# Patient Record
Sex: Female | Born: 1965 | Race: Black or African American | Hispanic: No | Marital: Married | Smoking: Never smoker
Health system: Southern US, Community
[De-identification: ages and names within clinical notes are randomized; demographics above are authoritative.]

---

## 1997-07-20 ENCOUNTER — Ambulatory Visit (HOSPITAL_COMMUNITY): Admission: RE | Admit: 1997-07-20 | Discharge: 1997-07-20 | Payer: Self-pay | Admitting: Obstetrics and Gynecology

## 1997-09-20 ENCOUNTER — Other Ambulatory Visit: Admission: RE | Admit: 1997-09-20 | Discharge: 1997-09-20 | Payer: Self-pay | Admitting: Obstetrics and Gynecology

## 1997-11-02 ENCOUNTER — Other Ambulatory Visit: Admission: RE | Admit: 1997-11-02 | Discharge: 1997-11-02 | Payer: Self-pay | Admitting: Obstetrics and Gynecology

## 1997-12-08 ENCOUNTER — Inpatient Hospital Stay (HOSPITAL_COMMUNITY): Admission: AD | Admit: 1997-12-08 | Discharge: 1997-12-11 | Payer: Self-pay | Admitting: Obstetrics and Gynecology

## 1997-12-11 ENCOUNTER — Encounter (HOSPITAL_COMMUNITY): Admission: RE | Admit: 1997-12-11 | Discharge: 1998-01-14 | Payer: Self-pay | Admitting: *Deleted

## 1998-08-22 ENCOUNTER — Other Ambulatory Visit: Admission: RE | Admit: 1998-08-22 | Discharge: 1998-08-22 | Payer: Self-pay | Admitting: Obstetrics and Gynecology

## 1999-07-14 ENCOUNTER — Other Ambulatory Visit: Admission: RE | Admit: 1999-07-14 | Discharge: 1999-07-14 | Payer: Self-pay | Admitting: *Deleted

## 1999-09-07 ENCOUNTER — Ambulatory Visit (HOSPITAL_COMMUNITY): Admission: RE | Admit: 1999-09-07 | Discharge: 1999-09-07 | Payer: Self-pay | Admitting: Obstetrics & Gynecology

## 1999-09-07 ENCOUNTER — Encounter: Payer: Self-pay | Admitting: Obstetrics & Gynecology

## 1999-10-12 ENCOUNTER — Ambulatory Visit (HOSPITAL_COMMUNITY): Admission: RE | Admit: 1999-10-12 | Discharge: 1999-10-12 | Payer: Self-pay | Admitting: Obstetrics and Gynecology

## 1999-10-12 ENCOUNTER — Encounter: Payer: Self-pay | Admitting: Obstetrics and Gynecology

## 2000-01-03 ENCOUNTER — Encounter: Payer: Self-pay | Admitting: Obstetrics and Gynecology

## 2000-01-03 ENCOUNTER — Ambulatory Visit (HOSPITAL_COMMUNITY): Admission: RE | Admit: 2000-01-03 | Discharge: 2000-01-03 | Payer: Self-pay | Admitting: Obstetrics and Gynecology

## 2000-02-08 ENCOUNTER — Inpatient Hospital Stay (HOSPITAL_COMMUNITY): Admission: AD | Admit: 2000-02-08 | Discharge: 2000-02-10 | Payer: Self-pay | Admitting: Obstetrics and Gynecology

## 2000-02-13 ENCOUNTER — Encounter: Admission: RE | Admit: 2000-02-13 | Discharge: 2000-05-13 | Payer: Self-pay | Admitting: Obstetrics and Gynecology

## 2000-09-06 ENCOUNTER — Other Ambulatory Visit: Admission: RE | Admit: 2000-09-06 | Discharge: 2000-09-06 | Payer: Self-pay | Admitting: Obstetrics and Gynecology

## 2001-03-06 ENCOUNTER — Other Ambulatory Visit: Admission: RE | Admit: 2001-03-06 | Discharge: 2001-03-06 | Payer: Self-pay | Admitting: Obstetrics and Gynecology

## 2001-04-30 ENCOUNTER — Ambulatory Visit (HOSPITAL_COMMUNITY): Admission: RE | Admit: 2001-04-30 | Discharge: 2001-04-30 | Payer: Self-pay | Admitting: Obstetrics and Gynecology

## 2001-04-30 ENCOUNTER — Encounter: Payer: Self-pay | Admitting: Obstetrics and Gynecology

## 2001-09-15 ENCOUNTER — Inpatient Hospital Stay (HOSPITAL_COMMUNITY): Admission: AD | Admit: 2001-09-15 | Discharge: 2001-09-17 | Payer: Self-pay | Admitting: Obstetrics and Gynecology

## 2013-02-04 ENCOUNTER — Other Ambulatory Visit: Payer: Self-pay | Admitting: Family Medicine

## 2015-07-14 ENCOUNTER — Emergency Department (HOSPITAL_COMMUNITY)
Admission: EM | Admit: 2015-07-14 | Discharge: 2015-07-14 | Disposition: A | Discharge status: Home / Self Care | Payer: No Typology Code available for payment source | Attending: Emergency Medicine | Admitting: Emergency Medicine

## 2015-07-14 ENCOUNTER — Encounter (HOSPITAL_COMMUNITY): Payer: Self-pay | Admitting: *Deleted

## 2015-07-14 DIAGNOSIS — Y998 Other external cause status: Secondary | ICD-10-CM | POA: Insufficient documentation

## 2015-07-14 DIAGNOSIS — S161XXA Strain of muscle, fascia and tendon at neck level, initial encounter: Secondary | ICD-10-CM | POA: Diagnosis not present

## 2015-07-14 DIAGNOSIS — S3992XA Unspecified injury of lower back, initial encounter: Secondary | ICD-10-CM | POA: Insufficient documentation

## 2015-07-14 DIAGNOSIS — S199XXA Unspecified injury of neck, initial encounter: Secondary | ICD-10-CM | POA: Diagnosis present

## 2015-07-14 DIAGNOSIS — Y9389 Activity, other specified: Secondary | ICD-10-CM | POA: Insufficient documentation

## 2015-07-14 DIAGNOSIS — Z79899 Other long term (current) drug therapy: Secondary | ICD-10-CM | POA: Diagnosis not present

## 2015-07-14 DIAGNOSIS — Y92410 Unspecified street and highway as the place of occurrence of the external cause: Secondary | ICD-10-CM | POA: Diagnosis not present

## 2015-07-14 MED ORDER — CYCLOBENZAPRINE HCL 10 MG PO TABS: 10.0000 mg | ORAL_TABLET | Freq: Two times a day (BID) | ORAL | Status: AC | PRN

## 2015-07-14 MED ORDER — IBUPROFEN 600 MG PO TABS: 600.0000 mg | ORAL_TABLET | Freq: Four times a day (QID) | ORAL | Status: AC | PRN

## 2015-07-14 MED ORDER — HYDROCODONE-ACETAMINOPHEN 5-325 MG PO TABS: 1.0000 | ORAL_TABLET | Freq: Four times a day (QID) | ORAL | Status: AC | PRN
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
## 2019-11-30 ENCOUNTER — Other Ambulatory Visit: Payer: Self-pay | Admitting: Family Medicine

## 2019-11-30 DIAGNOSIS — Z1231 Encounter for screening mammogram for malignant neoplasm of breast: Secondary | ICD-10-CM

## 2019-12-14 ENCOUNTER — Ambulatory Visit: Payer: Self-pay

## 2019-12-16 ENCOUNTER — Other Ambulatory Visit: Payer: Self-pay

## 2019-12-16 ENCOUNTER — Ambulatory Visit: Payer: Self-pay

## 2019-12-16 ENCOUNTER — Ambulatory Visit
Admission: RE | Admit: 2019-12-16 | Discharge: 2019-12-16 | Disposition: A | Discharge status: Home / Self Care | Payer: BC Managed Care – PPO | Source: Ambulatory Visit | Attending: Family Medicine | Admitting: Family Medicine

## 2019-12-16 DIAGNOSIS — Z1231 Encounter for screening mammogram for malignant neoplasm of breast: Secondary | ICD-10-CM

## 2019-12-24 ENCOUNTER — Ambulatory Visit: Payer: BC Managed Care – PPO | Attending: Sports Medicine | Admitting: Physical Therapy

## 2019-12-24 ENCOUNTER — Encounter: Payer: Self-pay | Admitting: Physical Therapy

## 2019-12-24 ENCOUNTER — Other Ambulatory Visit: Payer: Self-pay

## 2019-12-24 DIAGNOSIS — M545 Low back pain, unspecified: Secondary | ICD-10-CM

## 2019-12-24 DIAGNOSIS — M6281 Muscle weakness (generalized): Secondary | ICD-10-CM | POA: Insufficient documentation

## 2019-12-24 DIAGNOSIS — G8929 Other chronic pain: Secondary | ICD-10-CM | POA: Insufficient documentation

## 2019-12-24 DIAGNOSIS — M25561 Pain in right knee: Secondary | ICD-10-CM | POA: Diagnosis present

## 2019-12-24 NOTE — Patient Instructions (Signed)
Access Code: [email or web address] URL: [email or web address] Date: 12/24/2019 Prepared by: Lysle Rubens  Exercises Sit to Stand without Arm Support - 1 x daily - 7 x weekly - 3 sets - 10 reps - 3 sec hold Supine Lower Trunk Rotation - 1 x daily - 7 x weekly - 3 sets - 10 reps - 5 sec hold Supine Single Knee to Chest Stretch - 1 x daily - 7 x weekly - 3 sets - 10 reps - 5 sec hold Supine Bridge - 1 x daily - 7 x weekly - 3 sets - 10 reps - 3 sec hold Quadricep Stretch with Chair and Counter Support - 1 x daily - 7 x weekly - 3 sets - 2 reps - 30 sec hold

## 2019-12-24 NOTE — Therapy (Signed)
[facility] [address] [place], [place], [address] Phone: [phone]   Fax:  [phone]  Physical Therapy Evaluation  Patient Details  Name: Anne Bradley MRN: [id number] Date of Birth: 1965-12-19 Referring Provider (PT): Cleophas Dunker   Encounter Date: 12/24/2019   PT End of Session - 12/24/19 1201    Visit Number 1    Date for PT Re-Evaluation 02/23/20    PT Start Time 0845    PT Stop Time 0929    PT Time Calculation (min) 44 min    Activity Tolerance Patient tolerated treatment well    Behavior During Therapy [facility] for tasks assessed/performed           History reviewed. No pertinent past medical history.  History reviewed. No pertinent surgical history.  There were no vitals filed for this visit.    Subjective Assessment - 12/24/19 0847    Subjective Pt reports R knee pain and LBP worsening over the past couple of months. Pt had a prior lifting injury to LB during a workout in 2019 and has experienced some mild pain intermittently since then. 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 Pt denies radiating pain and no N/T in LE. Pt reports pain with prolonged standing, washing dishes, cooking. Pt is taking a round of prednisone; not sure yet if that is helping. Pt reports R knee pain with walking and prolonged standing.    Limitations Lifting;Standing;Walking;House hold activities    How long can you stand comfortably? <30 min    How long can you walk comfortably? 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 Yes    Pain Score 6     Pain Location Back    Pain Orientation Mid;Lower    Pain Descriptors / Indicators Aching;Dull     Pain Type Chronic pain    Pain Onset More than a month ago    Pain Frequency Constant    Aggravating Factors  prolonged standing, walking, bending over    Pain Relieving Factors lying down esp in fetal position, sitting, rest, lumbar pillow with driving, tylenol              [facility] PT Assessment - 12/24/19 0001      Assessment   Medical Diagnosis LBP and R knee pain    Referring Provider (PT) Cleophas Dunker    Prior Therapy None      Precautions   Precautions None      Restrictions   Weight Bearing Restrictions No      Balance Screen   Has the patient fallen in the past 6 months No    Has the patient had a decrease in activity level because of a fear of falling?  No    Is the patient reluctant to leave their home because of a fear of falling?  No      Home Environment   Additional Comments pt reports difficulty with knee pain with stairs      Prior Function   Level of Independence Independent    Vocation Full time employment    Vocation Requirements high school teacher  Sensation   Light Touch Appears Intact      Posture/Postural Control   Posture Comments excessive lumbar lordosis      ROM / Strength   AROM / PROM / Strength AROM;Strength      AROM   Overall AROM Comments lumbar extension 50% limited and uncomfortable; lumbar ROM otherwise 25% limited      Strength   Overall Strength Comments MMT BLE 5/5 except hip ext 3+/5 and hip abd 4-/5; pt reports functional weakness (has to pick up legs sometimes to get them in the car d/t fatigue)      Flexibility   Soft Tissue Assessment /Muscle Length yes    Hamstrings mild tightness    Quadriceps very tight    Piriformis very tight      Palpation   Palpation comment stiffness of lumbar spine      Transfers   Five time sit to stand comments  quick to fatigue and difficulty with eccentric control                      Objective measurements completed on examination: See above findings.       [facility]  Adult PT Treatment/Exercise - 12/24/19 0001      Exercises   Exercises Lumbar      Lumbar Exercises: Stretches   Single Knee to Chest Stretch 5 reps;10 seconds   with eccentric lowering and core engagement   Lower Trunk Rotation 5 reps;10 seconds    Quad Stretch Right;Left;1 rep;30 seconds    Quad Stretch Limitations with foot on chair      Lumbar Exercises: Seated   Sit to Stand 10 reps   no UE support; cues for eccentric control     Lumbar Exercises: Supine   Bridge 10 reps;3 seconds                  PT Education - 12/24/19 1201    Education Details Pt educated on HEP and POC    Person(s) Educated Patient    Methods Explanation;Demonstration    Comprehension Verbalized understanding;Returned demonstration            PT Short Term Goals - 12/24/19 1312      PT SHORT TERM GOAL #1   Title Pt will be independent with HEP    Time 2    Period Weeks    Status New    Target Date 01/07/20             PT Long Term Goals - 12/24/19 1313      PT LONG TERM GOAL #1   Title Pt will report able to stand >1 hr with no increase in LBP in order to return to work activities and functional ADLs    Time 8    Period Weeks    Status New    Target Date 02/18/20      PT LONG TERM GOAL #2   Title Pt will demonstrate hip abd/ext MMT 4+/5    Time 8    Period Weeks    Status New    Target Date 02/18/20      PT LONG TERM GOAL #3   Title Pt will report reduction in LBP by 50%    Time 8    Period Weeks    Status New    Target Date 02/18/20                  Plan - 12/24/19 1307  Clinical Impression Statement Pt presents to clinic with reports of chronic LBP without radiating pain and R knee pain present for the past ~2 years and worsening over the last two months; no known MOI. Pt has had xrays which she reports showed mild arthritic changes in the knee and lumbar spine. Pt denies N/T and radiating pain. Pt demos pain with prolonged standing and ADLs, limited  and painful lumbar extension, LE/lumbar stiffness/flexibility deficits, hip abd/ext strength deficits, functional LE weakness, and core weakness. 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 Pt would benefit from skilled PT to address the above impairments.    Personal Factors and Comorbidities Profession;Time since onset of injury/illness/exacerbation    Examination-Activity Limitations Carry;Stand;Stairs;Squat;Lift    Examination-Participation Restrictions Occupation;Meal Prep;Laundry;Community Activity    Stability/Clinical Decision Making Stable/Uncomplicated    Clinical Decision Making Low    Rehab Potential Good    PT Frequency 2x / week    PT Duration 8 weeks    PT Treatment/Interventions ADLs/Self Care Home Management;Cryotherapy;Electrical Stimulation;Ultrasound;Moist Heat;Iontophoresis 4mg /ml Dexamethasone;Gait training;Stair training;Therapeutic activities;Therapeutic exercise;Neuromuscular re-education;Manual techniques;Balance training;Patient/family education;Passive range of motion;Dry needling;Taping    PT Next Visit Plan LE strength/flexibility, core/lumbar stab, manual/modalities as indicated    PT Home Exercise Plan LTR, bridges, quad stretch, STS, sktc    Consulted and Agree with Plan of Care Patient           Patient will benefit from skilled therapeutic intervention in order to improve the following deficits and impairments:  Decreased range of motion, Difficulty walking, Increased muscle spasms, Pain, Impaired flexibility, Improper body mechanics, Decreased strength, Postural dysfunction  Visit Diagnosis: Chronic bilateral low back pain without sciatica  Muscle weakness (generalized)  Chronic pain of right knee     Problem List There are no problems to display for this patient.  , PT, DPT Lysle Rubens Marguita Venning 12/24/2019, 1:15 PM  [facility] [address], [place], [place] Phone: [phone]   Fax:  [phone]  Name: Anne Bradley MRN: Devona Konig Date of Birth: January 23, 1966

## 2019-12-29 ENCOUNTER — Ambulatory Visit: Payer: BC Managed Care – PPO | Admitting: Physical Therapy

## 2019-12-29 ENCOUNTER — Other Ambulatory Visit: Payer: Self-pay

## 2019-12-29 ENCOUNTER — Encounter: Payer: Self-pay | Admitting: Physical Therapy

## 2019-12-29 DIAGNOSIS — M545 Low back pain, unspecified: Secondary | ICD-10-CM

## 2019-12-29 DIAGNOSIS — M6281 Muscle weakness (generalized): Secondary | ICD-10-CM

## 2019-12-29 DIAGNOSIS — G8929 Other chronic pain: Secondary | ICD-10-CM

## 2019-12-29 NOTE — Therapy (Signed)
[facility] [address] [place], [place], [address] Phone: [phone]   Fax:  [phone]  Physical Therapy Treatment  Patient Details  Name: Anne Bradley MRN: [id number] Date of Birth: 07/21/1965 Referring Provider (PT): Layne Benton   Encounter Date: 12/29/2019   PT End of Session - 12/29/19 0848    Visit Number 2    Date for PT Re-Evaluation 02/23/20    PT Start Time 0801    PT Stop Time 0843    PT Time Calculation (min) 42 min    Activity Tolerance Patient tolerated treatment well    Behavior During Therapy [facility] for tasks assessed/performed           History reviewed. No pertinent past medical history.  History reviewed. No pertinent surgical history.  There were no vitals filed for this visit.   Subjective Assessment - 12/29/19 0804    Subjective Pt reports prednisone is helping her to feel better; states stretches/exercises are helping too but she is trying to find time to get them done    Currently in Pain? Yes    Pain Score 2     Pain Location Back                             [facility] Adult PT Treatment/Exercise - 12/29/19 0001      Lumbar Exercises: Aerobic   Recumbent Bike L1 x 6 min      Lumbar Exercises: Machines for Strengthening   Leg Press 40# 2x10    Other Lumbar Machine Exercise rows and lats 20# 2x10    Other Lumbar Machine Exercise shoulder ext 5# 2x10; AR 10# x10 B      Lumbar Exercises: Standing   Heel Raises 15 reps      Lumbar Exercises: Seated   Other Seated Lumbar Exercises sit to stand with yellow ball chest press and overhead raise x10      Lumbar Exercises: Supine   Dead Bug 10 reps;3 seconds    Dead Bug Limitations with exercise ball in sets of 2    Other Supine Lumbar Exercises dktc on exercise ball with isometric abs x10, dktc stretch                    PT Short Term Goals - 12/29/19 0854      PT SHORT TERM GOAL #1   Title Pt will be  independent with HEP    Baseline pt states she is working towards doing HEP consistently    Status Partially Met             PT Long Term Goals - 12/29/19 0854      PT LONG TERM GOAL #1   Title Pt will report able to stand >1 hr with no increase in LBP in order to return to work activities and functional ADLs    Status On-going      PT LONG TERM GOAL #2   Title Pt will demonstrate hip abd/ext MMT 4+/5    Status New      PT LONG TERM GOAL #3   Title Pt will report reduction in LBP by 50%    Status On-going                 Plan - 12/29/19 0849    Clinical Impression Statement Pt tolerated progression of TE well with no complaints of increased LBP with exercise.  Pt did have some increase in R knee pain with increased depth of leg press. Required cuing for core stab ex's for form and cuing to reduce compensation with standing shoulder extensions.    PT Treatment/Interventions ADLs/Self Care Home Management;Cryotherapy;Electrical Stimulation;Ultrasound;Moist Heat;Iontophoresis 53m/ml Dexamethasone;Gait training;Stair training;Therapeutic activities;Therapeutic exercise;Neuromuscular re-education;Manual techniques;Balance training;Patient/family education;Passive range of motion;Dry needling;Taping    PT Next Visit Plan LE strength/flexibility, core/lumbar stab, manual/modalities as indicated    Consulted and Agree with Plan of Care Patient           Patient will benefit from skilled therapeutic intervention in order to improve the following deficits and impairments:  Decreased range of motion, Difficulty walking, Increased muscle spasms, Pain, Impaired flexibility, Improper body mechanics, Decreased strength, Postural dysfunction  Visit Diagnosis: Chronic bilateral low back pain without sciatica  Muscle weakness (generalized)  Chronic pain of right knee     Problem List There are no problems to display for this patient.  AAmador Cunas PT, DPT ADonald Prose Demetric Dunnaway 12/29/2019, 8:55 AM  [place] [address]Phone: [phone]  Fax:  [phone] Name: Anne DISTELMRN: [id number] of Birth: [birth year]

## 2019-12-31 ENCOUNTER — Ambulatory Visit: Payer: BC Managed Care – PPO | Admitting: Physical Therapy

## 2019-12-31 ENCOUNTER — Other Ambulatory Visit: Payer: Self-pay

## 2019-12-31 DIAGNOSIS — M6281 Muscle weakness (generalized): Secondary | ICD-10-CM

## 2019-12-31 DIAGNOSIS — G8929 Other chronic pain: Secondary | ICD-10-CM

## 2019-12-31 DIAGNOSIS — M545 Low back pain, unspecified: Secondary | ICD-10-CM

## 2019-12-31 NOTE — Therapy (Signed)
[address] [place], [place], [address] Phone: [phone]   Fax:  [phone]  Physical Therapy Treatment  Patient Details  Name: Anne Bradley MRN: [id number] Date of Birth: 1965/11/26 Referring Provider (PT): Layne Benton   Encounter Date: 12/31/2019   PT End of Session - 12/31/19 0838    Visit Number 3    Date for PT Re-Evaluation 02/23/20    PT Start Time 0800    PT Stop Time 0843    PT Time Calculation (min) 43 min           No past medical history on file.  No past surgical history on file.  There were no vitals filed for this visit.   Subjective Assessment - 12/31/19 0803    Subjective just finished prednisone and back at school so some increased LBP    Currently in Pain? Yes    Pain Score 3     Pain Location Back    Pain Orientation Mid;Lower                             [facility] Adult PT Treatment/Exercise - 12/31/19 0001      Lumbar Exercises: Aerobic   UBE (Upper Arm Bike) L 4 2 fwd/2 back    Nustep L 4 5 min      Lumbar Exercises: Machines for Strengthening   Cybex Lumbar Extension black tband 2 sets 10    Other Lumbar Machine Exercise rows and lats 20# 2x10      Lumbar Exercises: Seated   Other Seated Lumbar Exercises sit to stand with yellow ball chest press and overhead raise x10    Other Seated Lumbar Exercises sit fit pelvic ROM and stab ex      Lumbar Exercises: Supine   Ab Set 10 reps;3 seconds    Bridge Non-compliant;10 reps;3 seconds   KTC and obl feet on ball     Manual Therapy   Manual Therapy Passive ROM    Passive ROM LE and trunk                  PT Education - 12/31/19 0837    Education Details desk ergonomics    Person(s) Educated Patient    Methods Explanation;Demonstration    Comprehension Verbalized understanding            PT Short Term Goals - 12/31/19 0838      PT SHORT TERM GOAL #1   Title Pt will be independent with  HEP    Status Achieved             PT Long Term Goals - 12/29/19 0854      PT LONG TERM GOAL #1   Title Pt will report able to stand >1 hr with no increase in LBP in order to return to work activities and functional ADLs    Status On-going      PT LONG TERM GOAL #2   Title Pt will demonstrate hip abd/ext MMT 4+/5    Status New      PT LONG TERM GOAL #3   Title Pt will report reduction in LBP by 50%    Status On-going                 Plan - 12/31/19 9458    Clinical Impression Statement STG met. Educ on desk ergonomics and improved sitting postue with computer work.  Progressed core stab ex with cues to engage. PROM end range tightness but fairly good ROM.    PT Treatment/Interventions ADLs/Self Care Home Management;Cryotherapy;Electrical Stimulation;Ultrasound;Moist Heat;Iontophoresis 88m/ml Dexamethasone;Gait training;Stair training;Therapeutic activities;Therapeutic exercise;Neuromuscular re-education;Manual techniques;Balance training;Patient/family education;Passive range of motion;Dry needling;Taping    PT Next Visit Plan LE strength/flexibility, core/lumbar stab, manual/modalities as indicated           Patient will benefit from skilled therapeutic intervention in order to improve the following deficits and impairments:  Decreased range of motion, Difficulty walking, Increased muscle spasms, Pain, Impaired flexibility, Improper body mechanics, Decreased strength, Postural dysfunction  Visit Diagnosis: Chronic bilateral low back pain without sciatica  Muscle weakness (generalized)     Problem List There are no problems to display for this patient.   Tuwana Kapaun,ANGIE PTA 12/31/2019, 8:44 AM  [place] [address]Phone: [phone]  Fax:  [phone] Name: Anne MILLIONMRN: [id number] of Birth: [birth year]

## 2020-01-05 ENCOUNTER — Encounter: Payer: Self-pay | Admitting: Physical Therapy

## 2020-01-05 ENCOUNTER — Ambulatory Visit: Payer: BC Managed Care – PPO | Admitting: Physical Therapy

## 2020-01-05 ENCOUNTER — Other Ambulatory Visit: Payer: Self-pay

## 2020-01-05 DIAGNOSIS — M545 Low back pain, unspecified: Secondary | ICD-10-CM

## 2020-01-05 DIAGNOSIS — M25561 Pain in right knee: Secondary | ICD-10-CM

## 2020-01-05 DIAGNOSIS — G8929 Other chronic pain: Secondary | ICD-10-CM

## 2020-01-05 DIAGNOSIS — M6281 Muscle weakness (generalized): Secondary | ICD-10-CM

## 2020-01-05 NOTE — Therapy (Signed)
[facility] [place] [address] [place], [place], [address] Phone: [phone]   Fax:  [phone]  Physical Therapy Treatment  Patient Details  Name: Anne Bradley MRN: [id number] Date of Birth: 1965-12-20 Referring Provider (PT): Cleophas Dunker   Encounter Date: 01/05/2020   PT End of Session - 01/05/20 0841    Visit Number 4    Date for PT Re-Evaluation 02/23/20    PT Start Time 0800    PT Stop Time 0841    PT Time Calculation (min) 41 min    Activity Tolerance Patient tolerated treatment well    Behavior During Therapy [facility] for tasks assessed/performed           History reviewed. No pertinent past medical history.  History reviewed. No pertinent surgical history.  There were no vitals filed for this visit.   Subjective Assessment - 01/05/20 0808    Subjective Pt reports she has been doing well overall; states that her LB is a little sore from standing so long at orientation    Currently in Pain? Yes    Pain Score 2     Pain Location Back                             [facility] Adult PT Treatment/Exercise - 01/05/20 0001      Lumbar Exercises: Stretches   Gastroc Stretch Right;Left;1 rep;30 seconds      Lumbar Exercises: Aerobic   Nustep L5 x 7 min      Lumbar Exercises: Machines for Strengthening   Leg Press 40# 2x10 BLE; heel raises 40# 2x10    Other Lumbar Machine Exercise rows and lats 20# 2x10    Other Lumbar Machine Exercise shoulder ext 10# 2x10; AR 10# x10 B      Lumbar Exercises: Standing   Heel Raises 15 reps    Other Standing Lumbar Exercises resisted gait 40# x5 each direction                    PT Short Term Goals - 12/31/19 8032      PT SHORT TERM GOAL #1   Title Pt will be independent with HEP    Status Achieved             PT Long Term Goals - 12/29/19 0854      PT LONG TERM GOAL #1   Title Pt will report able to stand >1 hr with no increase in LBP in order  to return to work activities and functional ADLs    Status On-going      PT LONG TERM GOAL #2   Title Pt will demonstrate hip abd/ext MMT 4+/5    Status New      PT LONG TERM GOAL #3   Title Pt will report reduction in LBP by 50%    Status On-going                 Plan - 01/05/20 1224    Clinical Impression Statement Pt tolerated exercise progression well with no reports of increased LBP during tx session. Pt did have some R knee pain with heel raises and leg press. Cues to engage core and maintain upright posture with standing shoulder extensions.    PT Treatment/Interventions ADLs/Self Care Home Management;Cryotherapy;Electrical Stimulation;Ultrasound;Moist Heat;Iontophoresis 4mg /ml Dexamethasone;Gait training;Stair training;Therapeutic activities;Therapeutic exercise;Neuromuscular re-education;Manual techniques;Balance training;Patient/family education;Passive range of motion;Dry needling;Taping    PT Next Visit  Plan LE strength/flexibility, core/lumbar stab, manual/modalities as indicated    Consulted and Agree with Plan of Care Patient           Patient will benefit from skilled therapeutic intervention in order to improve the following deficits and impairments:  Decreased range of motion, Difficulty walking, Increased muscle spasms, Pain, Impaired flexibility, Improper body mechanics, Decreased strength, Postural dysfunction  Visit Diagnosis: Chronic bilateral low back pain without sciatica  Muscle weakness (generalized)  Chronic pain of right knee     Problem List There are no problems to display for this patient.  Lysle Rubens, PT, DPT Maryanna Shape Ahnya Akre 01/05/2020, 8:43 AM  [facility] [place] [address] [place], [place], [address] Phone: [phone]   Fax:  [phone]  Name: CIARA KAGAN MRN: [id number] Date of Birth: 1966-02-05

## 2020-01-07 ENCOUNTER — Other Ambulatory Visit: Payer: Self-pay

## 2020-01-07 ENCOUNTER — Ambulatory Visit: Payer: BC Managed Care – PPO | Admitting: Physical Therapy

## 2020-01-07 DIAGNOSIS — M6281 Muscle weakness (generalized): Secondary | ICD-10-CM

## 2020-01-07 DIAGNOSIS — M545 Low back pain, unspecified: Secondary | ICD-10-CM

## 2020-01-07 DIAGNOSIS — G8929 Other chronic pain: Secondary | ICD-10-CM

## 2020-01-07 NOTE — Therapy (Signed)
[place] [place], [place], [address] Phone: [phone]   Fax:  [phone]  Physical Therapy Treatment  Patient Details  Name: Anne Bradley MRN: [id number] Date of Birth: [birth year] Referring Provider (PT): Layne Benton   Encounter Date: 01/07/2020   PT End of Session - 01/07/20 0834    Visit Number 5    Date for PT Re-Evaluation 02/23/20    PT Start Time 0800    PT Stop Time 0840    PT Time Calculation (min) 40 min           No past medical history on file.  No past surgical history on file.  There were no vitals filed for this visit.   Subjective Assessment - 01/07/20 0804    Subjective overall better 20-30%    Currently in Pain? Yes    Pain Score 1     Pain Location Back              [facility] PT Assessment - 01/07/20 0001      AROM   Overall AROM Comments WFLs except ext limited 50%                         [facility] Adult PT Treatment/Exercise - 01/07/20 0001      Self-Care   Self-Care Posture   sitting posture     Lumbar Exercises: Aerobic   UBE (Upper Arm Bike) L 4 2 fwd/2 back    Nustep L5 x 6 min      Lumbar Exercises: Machines for Strengthening   Cybex Lumbar Extension black tband 2 sets 10    Other Lumbar Machine Exercise rows and lats 25# 2x10      Lumbar Exercises: Standing   Other Standing Lumbar Exercises wt ball OH ext and rotation 15 x each   upright row with trunk ext 2 sets 10    Other Standing Lumbar Exercises opp arm and leg ext in standing at wall alt 20 reps 2 times   scap stab on wall for posture     Lumbar Exercises: Seated   Other Seated Lumbar Exercises sit to stand with yellow ball chest press and overhead raise x10      Lumbar Exercises: Supine   Bridge Compliant;10 reps;3 seconds    Single Leg Bridge Compliant;5 reps;1 second      Lumbar Exercises: Quadruped   Opposite Arm/Leg Raise Left arm/Right leg;Right arm/Left leg;20 reps;2 seconds                     PT Short Term Goals - 12/31/19 0630      PT SHORT TERM GOAL #1   Title Pt will be independent with HEP    Status Achieved             PT Long Term Goals - 01/07/20 0809      PT LONG TERM GOAL #1   Title Pt will report able to stand >1 hr with no increase in LBP in order to return to work activities and functional ADLs    Baseline about 45 min    Status Partially Met      PT LONG TERM GOAL #2   Title Pt will demonstrate hip abd/ext MMT 4+/5    Status Partially Met      PT LONG TERM GOAL #3   Title Pt will report reduction in LBP by 50%  Baseline 20-30%    Status Partially Met                 Plan - 01/07/20 0835    Clinical Impression Statement progressing with goals. lumbar ROM is WFLs except ext so focused ex today on ext ROM and strength. cuing for tech and core engagement. postural educ. given throughout session for better mech iwth sitting and in classroom    PT Treatment/Interventions ADLs/Self Care Home Management;Cryotherapy;Electrical Stimulation;Ultrasound;Moist Heat;Iontophoresis 80m/ml Dexamethasone;Gait training;Stair training;Therapeutic activities;Therapeutic exercise;Neuromuscular re-education;Manual techniques;Balance training;Patient/family education;Passive range of motion;Dry needling;Taping    PT Next Visit Plan LE strength/flexibility, core/lumbar stab           Patient will benefit from skilled therapeutic intervention in order to improve the following deficits and impairments:  Decreased range of motion, Difficulty walking, Increased muscle spasms, Pain, Impaired flexibility, Improper body mechanics, Decreased strength, Postural dysfunction  Visit Diagnosis: Chronic bilateral low back pain without sciatica  Muscle weakness (generalized)     Problem List There are no problems to display for this patient.   Khyran Riera,ANGIE PTA 01/07/2020, 8:39 AM  [address] [address]Phone: [phone]  Fax:  [phone] Name: ICAMERON KATAYAMAMRN: [id number] of Birth: [birth year]

## 2020-01-12 ENCOUNTER — Encounter: Payer: Self-pay | Admitting: Physical Therapy

## 2020-01-12 ENCOUNTER — Ambulatory Visit: Payer: BC Managed Care – PPO | Admitting: Physical Therapy

## 2020-01-12 ENCOUNTER — Other Ambulatory Visit: Payer: Self-pay

## 2020-01-12 DIAGNOSIS — M545 Low back pain, unspecified: Secondary | ICD-10-CM

## 2020-01-12 DIAGNOSIS — M6281 Muscle weakness (generalized): Secondary | ICD-10-CM

## 2020-01-12 DIAGNOSIS — G8929 Other chronic pain: Secondary | ICD-10-CM

## 2020-01-12 NOTE — Therapy (Signed)
[place] [place] [place], [place], [address] Phone: [phone]   Fax:  [phone]  Physical Therapy Treatment  Patient Details  Name: Anne Bradley MRN: [id number] Date of Birth: 28-May-1965 Referring Provider (PT): Layne Benton   Encounter Date: 01/12/2020   PT End of Session - 01/12/20 0846    Visit Number 6    Date for PT Re-Evaluation 02/23/20    PT Start Time 0759    PT Stop Time 0842    PT Time Calculation (min) 43 min    Activity Tolerance Patient tolerated treatment well    Behavior During Therapy [facility] for tasks assessed/performed           History reviewed. No pertinent past medical history.  History reviewed. No pertinent surgical history.  There were no vitals filed for this visit.   Subjective Assessment - 01/12/20 0803    Subjective Pt reports back is feeling better overall. Pt reports knee hasn't bothered her very much lately. Was able to make it through school day with some soreness/pain.    Currently in Pain? Yes    Pain Score 1     Pain Location Back                             [facility] Adult PT Treatment/Exercise - 01/12/20 0001      Lumbar Exercises: Aerobic   Elliptical R5 2 min fwd/ 2 min bkwd    Nustep L5 x 7 min      Lumbar Exercises: Machines for Strengthening   Leg Press 40# 2x10 BLE; heel raises 40# 2x15    Other Lumbar Machine Exercise rows and lats 35# 2x10    Other Lumbar Machine Exercise shoulder ext 10# 2x10; AR 10# x10 B      Lumbar Exercises: Standing   Heel Raises 15 reps    Other Standing Lumbar Exercises resisted gait 40# 4x each direction      Lumbar Exercises: Seated   Other Seated Lumbar Exercises sit to stand with yellow ball chest press and overhead raise x10                    PT Short Term Goals - 12/31/19 3846      PT SHORT TERM GOAL #1   Title Pt will be independent with HEP    Status Achieved             PT Long  Term Goals - 01/07/20 0809      PT LONG TERM GOAL #1   Title Pt will report able to stand >1 hr with no increase in LBP in order to return to work activities and functional ADLs    Baseline about 45 min    Status Partially Met      PT LONG TERM GOAL #2   Title Pt will demonstrate hip abd/ext MMT 4+/5    Status Partially Met      PT LONG TERM GOAL #3   Title Pt will report reduction in LBP by 50%    Baseline 20-30%    Status Partially Met                 Plan - 01/12/20 0846    Clinical Impression Statement Pt did well with progression of TE today. Reinforced education on posture/mechanics in classroom. Demos carryover from last rx; required less cuing for core engagement and form with  postural ex's.    PT Treatment/Interventions ADLs/Self Care Home Management;Cryotherapy;Electrical Stimulation;Ultrasound;Moist Heat;Iontophoresis 25m/ml Dexamethasone;Gait training;Stair training;Therapeutic activities;Therapeutic exercise;Neuromuscular re-education;Manual techniques;Balance training;Patient/family education;Passive range of motion;Dry needling;Taping    PT Next Visit Plan LE strength/flexibility, core/lumbar stab    Consulted and Agree with Plan of Care Patient           Patient will benefit from skilled therapeutic intervention in order to improve the following deficits and impairments:  Decreased range of motion, Difficulty walking, Increased muscle spasms, Pain, Impaired flexibility, Improper body mechanics, Decreased strength, Postural dysfunction  Visit Diagnosis: Chronic bilateral low back pain without sciatica  Muscle weakness (generalized)  Chronic pain of right knee     Problem List There are no problems to display for this patient.  AAmador Cunas PT, DPT ADonald ProseSugg 01/12/2020, 8:48 AM  [address] [address]Phone: [phone]  Fax:  [phone] Name: Anne PERDOMOMRN: [id number] of Birth: 817-Oct-1967

## 2020-01-14 ENCOUNTER — Other Ambulatory Visit: Payer: Self-pay

## 2020-01-14 ENCOUNTER — Ambulatory Visit: Payer: BC Managed Care – PPO | Admitting: Physical Therapy

## 2020-01-14 DIAGNOSIS — G8929 Other chronic pain: Secondary | ICD-10-CM

## 2020-01-14 DIAGNOSIS — M545 Low back pain, unspecified: Secondary | ICD-10-CM

## 2020-01-14 DIAGNOSIS — M6281 Muscle weakness (generalized): Secondary | ICD-10-CM

## 2020-01-14 NOTE — Therapy (Signed)
[address] [place], [place], [address] Phone: [phone]   Fax:  [phone]  Physical Therapy Treatment  Patient Details  Name: Anne Bradley MRN: [id number] Date of Birth: [birth year] Referring Provider (PT): Layne Benton   Encounter Date: 01/14/2020   PT End of Session - 01/14/20 0841    Visit Number 7    Date for PT Re-Evaluation 02/23/20    PT Start Time 0805    PT Stop Time 0947    PT Time Calculation (min) 38 min           No past medical history on file.  No past surgical history on file.  There were no vitals filed for this visit.   Subjective Assessment - 01/14/20 0804    Subjective extension ex and stretches help    Currently in Pain? Yes    Pain Score 1     Pain Location Back    Pain Orientation Lower              [facility] PT Assessment - 01/14/20 0001      AROM   Overall AROM Comments WFLS except ext limited 25%                         [facility] Adult PT Treatment/Exercise - 01/14/20 0001      Lumbar Exercises: Aerobic   Elliptical R 5 I 6 3 fwd/3 back      Lumbar Exercises: Machines for Strengthening   Cybex Lumbar Extension black tband 20x    Leg Press 50# 2x15 BLE; heel raises 50# 2x15    Other Lumbar Machine Exercise rows and lats 35# 2x15   wall push up with hip ext alt 20   Other Lumbar Machine Exercise AR press and circles   4# 3 pt back ext stab on wall 10 each     Lumbar Exercises: Standing   Other Standing Lumbar Exercises step up fwd with opp hip ext 10 BIL 6 inch   lat step up with opp hip abd 10 BIL 6 inch     Lumbar Exercises: Quadruped   Opposite Arm/Leg Raise Left arm/Right leg;Right arm/Left leg;20 reps;2 seconds    Other Quadruped Lumbar Exercises superman 15x                  PT Education - 01/14/20 0840    Education Details step ups at home fwd and SW for knee,hip and back ext    Person(s) Educated Patient    Methods  Explanation;Demonstration    Comprehension Verbalized understanding;Returned demonstration            PT Short Term Goals - 12/31/19 0838      PT SHORT TERM GOAL #1   Title Pt will be independent with HEP    Status Achieved             PT Long Term Goals - 01/14/20 0839      PT LONG TERM GOAL #1   Title Pt will report able to stand >1 hr with no increase in LBP in order to return to work activities and functional ADLs    Status Partially Met      PT LONG TERM GOAL #2   Title Pt will demonstrate hip abd/ext MMT 4+/5    Status Partially Met      PT LONG TERM GOAL #3   Title Pt will report reduction in LBP  by 50%    Baseline 30 plus%    Status Partially Met                 Plan - 01/14/20 0841    Clinical Impression Statement progressing with core stab ,hip and knee, added step up with opp leg ext and abd to HEP. increased some wt today and tolerated okay but aware of increased wt-fatigued with AR ex. cuing needed to engae core and not compensate with ex. progressingwith goals.    PT Treatment/Interventions ADLs/Self Care Home Management;Cryotherapy;Electrical Stimulation;Ultrasound;Moist Heat;Iontophoresis 52m/ml Dexamethasone;Gait training;Stair training;Therapeutic activities;Therapeutic exercise;Neuromuscular re-education;Manual techniques;Balance training;Patient/family education;Passive range of motion;Dry needling;Taping    PT Next Visit Plan LE strength/flexibility, core/lumbar stab           Patient will benefit from skilled therapeutic intervention in order to improve the following deficits and impairments:  Decreased range of motion, Difficulty walking, Increased muscle spasms, Pain, Impaired flexibility, Improper body mechanics, Decreased strength, Postural dysfunction  Visit Diagnosis: Chronic bilateral low back pain without sciatica  Muscle weakness (generalized)  Chronic pain of right knee     Problem List There are no problems to display  for this patient.   Jaynee Winters,ANGIE PTA 01/14/2020, 8:43 AM  [address] [address]Phone: [phone]  Fax:  [phone] Name: Anne KHOKHARMRN: [id number] of Birth: 823-Feb-1967

## 2020-01-19 ENCOUNTER — Ambulatory Visit: Payer: BC Managed Care – PPO | Admitting: Physical Therapy

## 2020-01-19 ENCOUNTER — Encounter: Payer: Self-pay | Admitting: Physical Therapy

## 2020-01-19 ENCOUNTER — Other Ambulatory Visit: Payer: Self-pay

## 2020-01-19 DIAGNOSIS — G8929 Other chronic pain: Secondary | ICD-10-CM

## 2020-01-19 DIAGNOSIS — M545 Low back pain, unspecified: Secondary | ICD-10-CM

## 2020-01-19 DIAGNOSIS — M25561 Pain in right knee: Secondary | ICD-10-CM

## 2020-01-19 NOTE — Therapy (Signed)
[place], [place], [address] Phone: [phone]   Fax:  [phone]  Physical Therapy Treatment  Patient Details  Name: Anne Bradley MRN: [id number] Date of Birth: Nov 21, 1965 Referring Provider (PT): Layne Benton   Encounter Date: 01/19/2020   PT End of Session - 01/19/20 0847    Visit Number 8    Date for PT Re-Evaluation 02/23/20    PT Start Time 0849    PT Stop Time 0000    PT Time Calculation (min) 911 min    Activity Tolerance Patient tolerated treatment well    Behavior During Therapy [facility] for tasks assessed/performed           History reviewed. No pertinent past medical history.  History reviewed. No pertinent surgical history.  There were no vitals filed for this visit.   Subjective Assessment - 01/19/20 0804    Subjective Yesterday was trying to get use to standing up all day, had some LBP from it          Pain 0/10                   [facility] Adult PT Treatment/Exercise - 01/19/20 0001      Lumbar Exercises: Aerobic   Elliptical R 5 I 6 3 fwd/3 back      Lumbar Exercises: Machines for Strengthening   Cybex Lumbar Extension black tband 2x15    Cybex Knee Extension 10lb 2x10     Cybex Knee Flexion 20lb 2x15     Leg Press 50# 2x15 BLE; heel raises 50# 2x15    Other Lumbar Machine Exercise rows and lats 35# 2x15    Other Lumbar Machine Exercise AR press 20lb x10 each       Lumbar Exercises: Standing   Shoulder Extension 20 reps;Power Engineering geologist Limitations 10    Other Standing Lumbar Exercises Forward step is 6in x 10 each     Other Standing Lumbar Exercises 4in lateral step ups x 10 each                    PT Short Term Goals - 12/31/19 7544      PT SHORT TERM GOAL #1   Title Pt will be independent with HEP    Status Achieved             PT Long Term Goals - 01/14/20 0839      PT LONG TERM GOAL #1   Title Pt will report  able to stand >1 hr with no increase in LBP in order to return to work activities and functional ADLs    Status Partially Met      PT LONG TERM GOAL #2   Title Pt will demonstrate hip abd/ext MMT 4+/5    Status Partially Met      PT LONG TERM GOAL #3   Title Pt will report reduction in LBP by 50%    Baseline 30 plus%    Status Partially Met                  Patient will benefit from skilled therapeutic intervention in order to improve the following deficits and impairments:  Decreased range of motion, Difficulty walking, Increased muscle spasms, Pain, Impaired flexibility, Improper body mechanics, Decreased strength, Postural dysfunction  Visit Diagnosis: Chronic pain of right knee  Chronic bilateral low back pain without sciatica     Problem List  There are no problems to display for this patient.   Scot Jun, PTA 01/19/2020, 8:50 AM  [address] [address] [place], [place], [address] Phone: [phone]   Fax:  [phone]  Name: Anne Bradley MRN: [id number] Date of Birth: 1965/12/19

## 2020-01-21 ENCOUNTER — Other Ambulatory Visit: Payer: Self-pay

## 2020-01-21 ENCOUNTER — Ambulatory Visit: Payer: BC Managed Care – PPO | Attending: Sports Medicine | Admitting: Physical Therapy

## 2020-01-21 DIAGNOSIS — G8929 Other chronic pain: Secondary | ICD-10-CM | POA: Diagnosis present

## 2020-01-21 DIAGNOSIS — M25561 Pain in right knee: Secondary | ICD-10-CM | POA: Diagnosis not present

## 2020-01-21 DIAGNOSIS — M6281 Muscle weakness (generalized): Secondary | ICD-10-CM

## 2020-01-21 DIAGNOSIS — M545 Low back pain, unspecified: Secondary | ICD-10-CM

## 2020-01-21 NOTE — Therapy (Signed)
[address] [place], [place], [address] Phone: [phone]   Fax:  [phone]  Physical Therapy Treatment  Patient Details  Name: AZRIELLA MATTIA MRN: [id number] Date of Birth: 1966/04/24 Referring Provider (PT): Layne Benton   Encounter Date: 01/21/2020   PT End of Session - 01/21/20 0812    Visit Number 9    Date for PT Re-Evaluation 02/23/20    PT Start Time 0805    PT Stop Time 0848    PT Time Calculation (min) 43 min           No past medical history on file.  No past surgical history on file.  There were no vitals filed for this visit.   Subjective Assessment - 01/21/20 0807    Subjective 80% better overall. back is doing great with pain and endurance in standing, knee is weak    Currently in Pain? No/denies                             [facility] Adult PT Treatment/Exercise - 01/21/20 0001      Lumbar Exercises: Aerobic   UBE (Upper Arm Bike) L4 2 fwd/2back STANDING    Nustep L5 x 7 min      Lumbar Exercises: Machines for Strengthening   Cybex Lumbar Extension black tband 2x15    Cybex Knee Extension 15# 2 sets 10 hold 3 sec at TKE    Cybex Knee Flexion 25# 2x15     Leg Press 50# 2x15 BLE; heel raises 50# 2x15    Other Lumbar Machine Exercise rows and lats 35# 2x15    Other Lumbar Machine Exercise cable pulley ext 2 sets 15      Lumbar Exercises: Standing   Other Standing Lumbar Exercises 6# dead lift into upright row 2 sets 10   squat on airex with abd lift 10x   Other Standing Lumbar Exercises STS 2 sets 10 with wt ball on airex                    PT Short Term Goals - 12/31/19 0388      PT SHORT TERM GOAL #1   Title Pt will be independent with HEP    Status Achieved             PT Long Term Goals - 01/21/20 8280      PT LONG TERM GOAL #1   Title Pt will report able to stand >1 hr with no increase in LBP in order to return to work activities and  functional ADLs    Status Achieved      PT LONG TERM GOAL #2   Title Pt will demonstrate hip abd/ext MMT 4+/5    Status Partially Met      PT LONG TERM GOAL #3   Title Pt will report reduction in LBP by 50%    Status Achieved                 Plan - 01/21/20 0814    Clinical Impression Statement progressing with goals ,decreased pain, increased func,ROM and strength. discussed HEP and body mechanics ,educ on arthritis and joint safety and protection    PT Treatment/Interventions ADLs/Self Care Home Management;Cryotherapy;Electrical Stimulation;Ultrasound;Moist Heat;Iontophoresis 63m/ml Dexamethasone;Gait training;Stair training;Therapeutic activities;Therapeutic exercise;Neuromuscular re-education;Manual techniques;Balance training;Patient/family education;Passive range of motion;Dry needling;Taping    PT Next Visit Plan assure good with HEP and D/C vs  HOLD after next 2 sessions           Patient will benefit from skilled therapeutic intervention in order to improve the following deficits and impairments:  Decreased range of motion, Difficulty walking, Increased muscle spasms, Pain, Impaired flexibility, Improper body mechanics, Decreased strength, Postural dysfunction  Visit Diagnosis: Chronic pain of right knee  Chronic bilateral low back pain without sciatica  Muscle weakness (generalized)     Problem List There are no problems to display for this patient.   Adali Pennings,ANGIE PTA 01/21/2020, 8:44 AM  [place], [place], [address] Phone: [phone]   Fax:  [phone]  Name: COLLEN HOSTLER MRN: [id number] Date of Birth: 07-13-1965

## 2020-01-26 ENCOUNTER — Other Ambulatory Visit: Payer: Self-pay

## 2020-01-26 ENCOUNTER — Ambulatory Visit: Payer: BC Managed Care – PPO | Admitting: Physical Therapy

## 2020-01-26 ENCOUNTER — Encounter: Payer: Self-pay | Admitting: Physical Therapy

## 2020-01-26 DIAGNOSIS — G8929 Other chronic pain: Secondary | ICD-10-CM

## 2020-01-26 DIAGNOSIS — M545 Low back pain, unspecified: Secondary | ICD-10-CM

## 2020-01-26 DIAGNOSIS — M25561 Pain in right knee: Secondary | ICD-10-CM | POA: Diagnosis not present

## 2020-01-26 DIAGNOSIS — M6281 Muscle weakness (generalized): Secondary | ICD-10-CM

## 2020-01-26 NOTE — Therapy (Signed)
[address] [place], [place], [address] Phone: [phone]   Fax:  [phone]  Physical Therapy Treatment  Patient Details  Name: Anne Bradley MRN: [id number] Date of Birth: 19-Mar-1966 Referring Provider (PT): Layne Benton   Encounter Date: 01/26/2020   PT End of Session - 01/26/20 0840    Visit Number 10    Date for PT Re-Evaluation 02/23/20    PT Start Time 0800    PT Stop Time 0842    PT Time Calculation (min) 42 min    Activity Tolerance Patient tolerated treatment well    Behavior During Therapy [facility] for tasks assessed/performed           History reviewed. No pertinent past medical history.  History reviewed. No pertinent surgical history.  There were no vitals filed for this visit.   Subjective Assessment - 01/26/20 0803    Subjective Been walking at home, Feeling good today. Some stiffness Saturday and Sunday, was good after got moving    Currently in Pain? No/denies                             [facility] Adult PT Treatment/Exercise - 01/26/20 0001      Lumbar Exercises: Aerobic   UBE (Upper Arm Bike) L4 2 fwd/2back STANDING    Nustep L5 x 5 min      Lumbar Exercises: Machines for Strengthening   Cybex Lumbar Extension black tband 2x15    Cybex Knee Extension 15# 2 sets 10 hold 3 sec at TKE    Cybex Knee Flexion 25# 2x15     Leg Press 50# 2x15 BLE; heel raises 50# 2x15    Other Lumbar Machine Exercise rows and lats 35# 2x15    Other Lumbar Machine Exercise shoulder ext 10# 2x10; AR 10# x10 B      Lumbar Exercises: Standing   Other Standing Lumbar Exercises STS 2 sets 10 OHP with wt ball on airex                    PT Short Term Goals - 12/31/19 4235      PT SHORT TERM GOAL #1   Title Pt will be independent with HEP    Status Achieved             PT Long Term Goals - 01/21/20 3614      PT LONG TERM GOAL #1   Title Pt will report able to stand >1 hr  with no increase in LBP in order to return to work activities and functional ADLs    Status Achieved      PT LONG TERM GOAL #2   Title Pt will demonstrate hip abd/ext MMT 4+/5    Status Partially Met      PT LONG TERM GOAL #3   Title Pt will report reduction in LBP by 50%    Status Achieved                 Plan - 01/26/20 0841    Clinical Impression Statement Pt continues to do well. She reports stiffness a home with in mobility. Postural cues needed to prevent trunk flexion with shoulder extensions. Cues needed for pacing with anti rotational presses.    Personal Factors and Comorbidities Profession;Time since onset of injury/illness/exacerbation    Examination-Participation Restrictions Cleaning    Rehab Potential Good    PT Frequency 2x /  week    PT Duration 8 weeks    PT Treatment/Interventions ADLs/Self Care Home Management;Cryotherapy;Electrical Stimulation;Ultrasound;Moist Heat;Iontophoresis 57m/ml Dexamethasone;Gait training;Stair training;Therapeutic activities;Therapeutic exercise;Neuromuscular re-education;Manual techniques;Balance training;Patient/family education;Passive range of motion;Dry needling;Taping    PT Next Visit Plan assure good with HEP and D/C vs HOLD after next           Patient will benefit from skilled therapeutic intervention in order to improve the following deficits and impairments:  Decreased range of motion, Difficulty walking, Increased muscle spasms, Pain, Impaired flexibility, Improper body mechanics, Decreased strength, Postural dysfunction  Visit Diagnosis: Muscle weakness (generalized)  Chronic bilateral low back pain without sciatica  Chronic pain of right knee     Problem List There are no problems to display for this patient.   RScot Jun PTA 01/26/2020, 8:43 AM  [address] [address]: [phone]  Fax:   [phone] Name: Anne FAUSTOMRN: [id number] of Birth: [birth year]

## 2020-01-28 ENCOUNTER — Other Ambulatory Visit: Payer: Self-pay

## 2020-01-28 ENCOUNTER — Ambulatory Visit: Payer: BC Managed Care – PPO | Admitting: Physical Therapy

## 2020-01-28 ENCOUNTER — Encounter: Payer: Self-pay | Admitting: Physical Therapy

## 2020-01-28 DIAGNOSIS — M25561 Pain in right knee: Secondary | ICD-10-CM | POA: Diagnosis not present

## 2020-01-28 DIAGNOSIS — M545 Low back pain, unspecified: Secondary | ICD-10-CM

## 2020-01-28 DIAGNOSIS — M6281 Muscle weakness (generalized): Secondary | ICD-10-CM

## 2020-01-28 DIAGNOSIS — G8929 Other chronic pain: Secondary | ICD-10-CM

## 2020-01-28 NOTE — Therapy (Signed)
[place] [address], [place], [address] Phone: [phone]   Fax:  [phone]  Physical Therapy Treatment  Patient Details  Name: Anne Bradley MRN: [id number] Date of Birth: 02-Jan-1966 Referring Provider (PT): Layne Benton   Encounter Date: 01/28/2020   PT End of Session - 01/28/20 0839    Visit Number 11    Date for PT Re-Evaluation 02/23/20    PT Start Time 0802    PT Stop Time 0841    PT Time Calculation (min) 39 min    Activity Tolerance Patient tolerated treatment well    Behavior During Therapy [facility] for tasks assessed/performed           History reviewed. No pertinent past medical history.  History reviewed. No pertinent surgical history.  There were no vitals filed for this visit.   Subjective Assessment - 01/28/20 0807    Subjective Pt reports feeling pretty good; would like to be on hold after today    Currently in Pain? No/denies    Pain Score 0-No pain                             [facility] Adult PT Treatment/Exercise - 01/28/20 0001      Lumbar Exercises: Stretches   Gastroc Stretch Right;Left;1 rep;30 seconds      Lumbar Exercises: Aerobic   Elliptical R 5 I 6 3 fwd/2 back      Lumbar Exercises: Machines for Strengthening   Cybex Lumbar Extension black tband 2x15    Cybex Knee Extension 15# 2 sets 10 hold 3 sec at TKE    Cybex Knee Flexion 25# 2x15     Leg Press 50# 2x15 BLE; heel raises 50# 2x15    Other Lumbar Machine Exercise rows and lats 35# 2x10    Other Lumbar Machine Exercise shoulder ext 10# 2x10; AR 10# 1x10 B      Lumbar Exercises: Standing   Heel Raises 15 reps                    PT Short Term Goals - 12/31/19 9458      PT SHORT TERM GOAL #1   Title Pt will be independent with HEP    Status Achieved             PT Long Term Goals - 01/28/20 0809      PT LONG TERM GOAL #1   Title Pt will report able to stand >1 hr with no  increase in LBP in order to return to work activities and functional ADLs    Status Achieved      PT LONG TERM GOAL #2   Title Pt will demonstrate hip abd/ext MMT 4+/5    Baseline hip abd 4+/5, hip ext 4/5    Status Partially Met      PT LONG TERM GOAL #3   Title Pt will report reduction in LBP by 50%    Status Achieved                 Plan - 01/28/20 0840    Clinical Impression Statement Pt put on hold at end of rx today; has met most goals and is still working toward gaining hip abd/ext strength. Pt did well with ex's today with no increased LBP with any ex's. Able to tolerate several standing ex's. Educated on importance of continuance of HEP and when to  return if symptoms recur with VU.           Patient will benefit from skilled therapeutic intervention in order to improve the following deficits and impairments:     Visit Diagnosis: Muscle weakness (generalized)  Chronic bilateral low back pain without sciatica  Chronic pain of right knee     Problem List There are no problems to display for this patient.  Amador Cunas, PT, DPT Anne Bradley Anne Bradley 01/28/2020, 8:44 AM  [address] [place], [place], [address] Phone: [phone]   Fax:  [phone]  Name: Anne Bradley MRN: [id number] Date of Birth: 01-Dec-1965

## 2022-01-01 IMAGING — MG DIGITAL SCREENING BILAT W/ CAD
4 series · 4 of 4 positions shown · non-contrast
Comparison: Previous exam(s).

CLINICAL DATA: Screening.

EXAM:
DIGITAL SCREENING BILATERAL MAMMOGRAM WITH CAD

[R MLO]
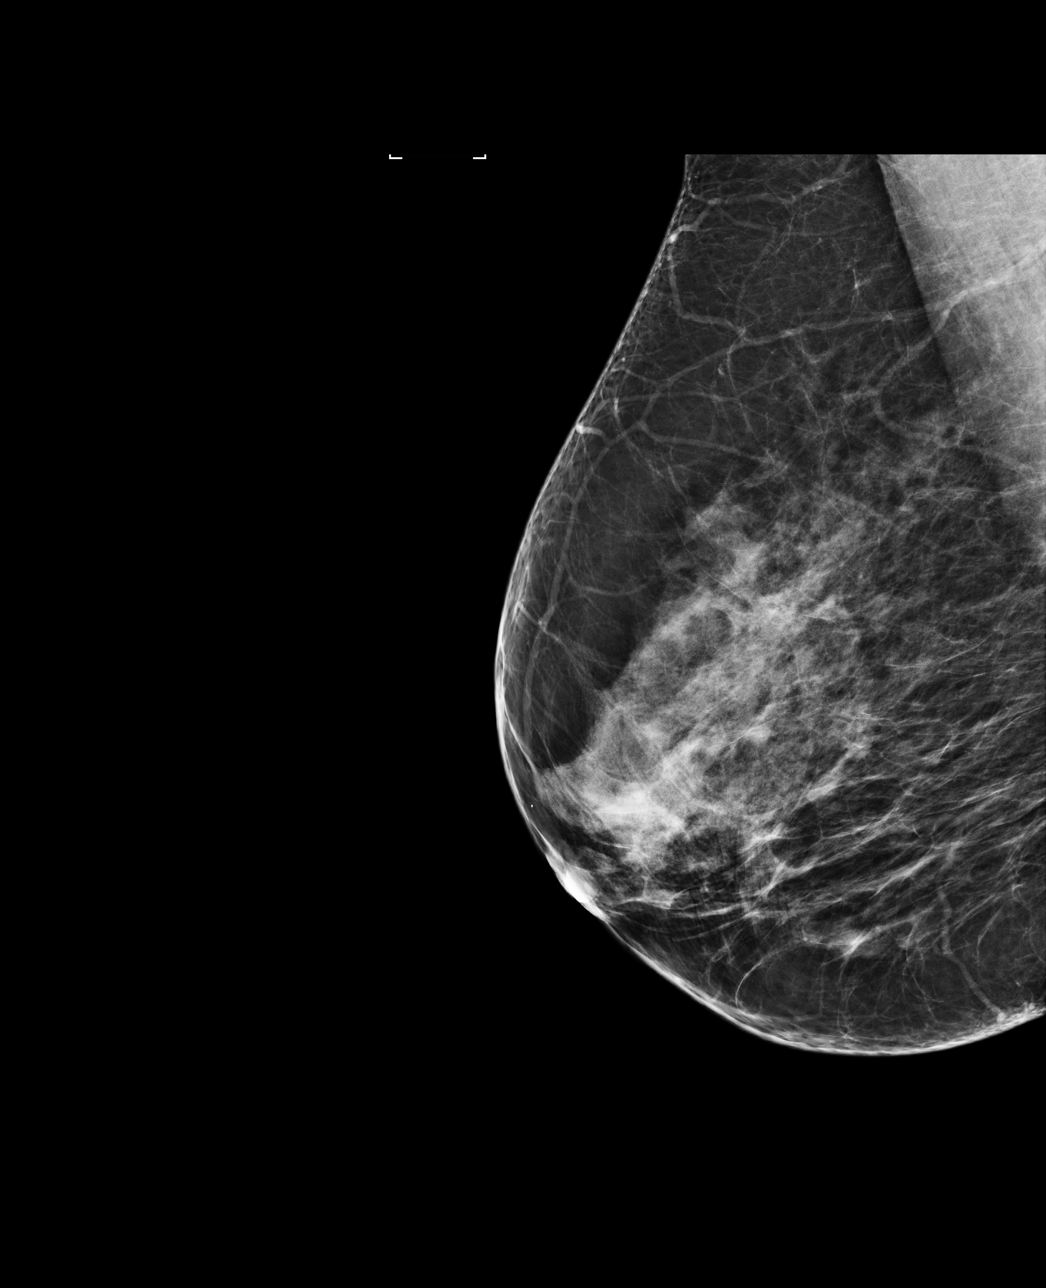

[R CC]
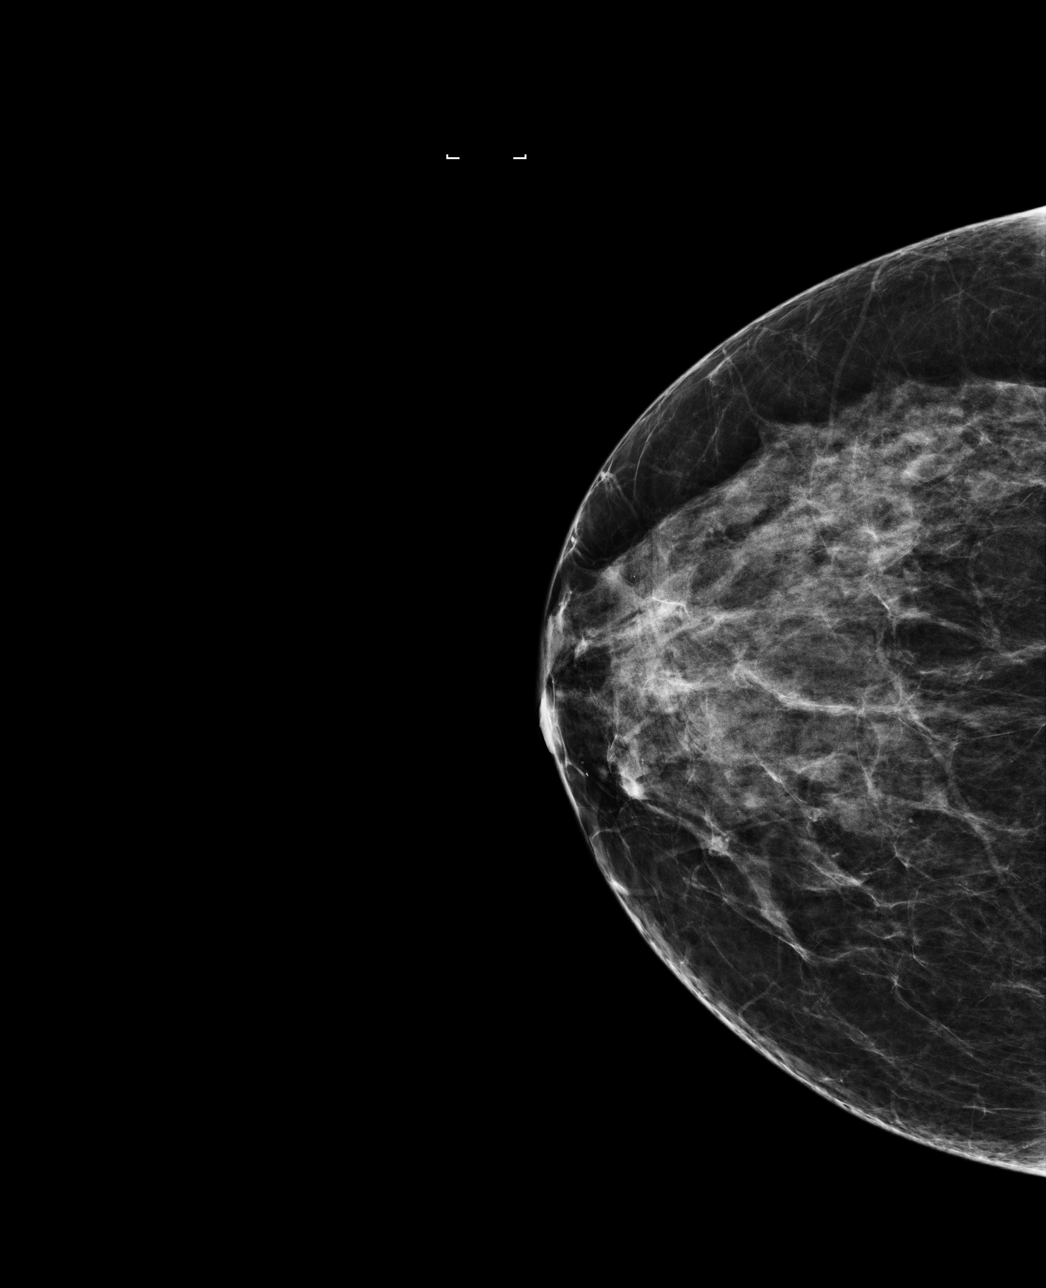

[L MLO]
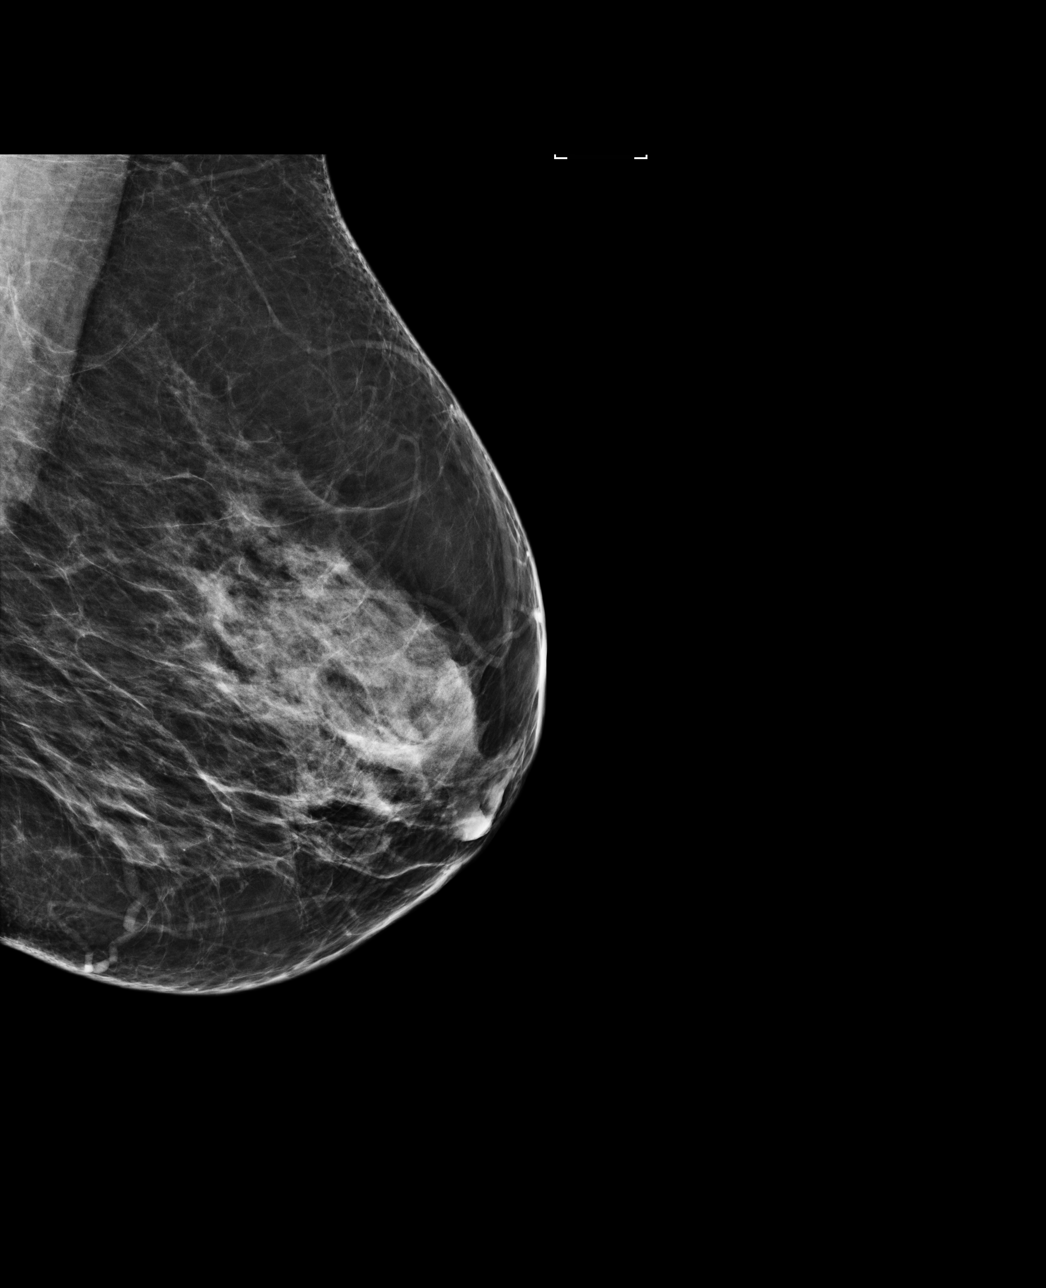

[L CC]
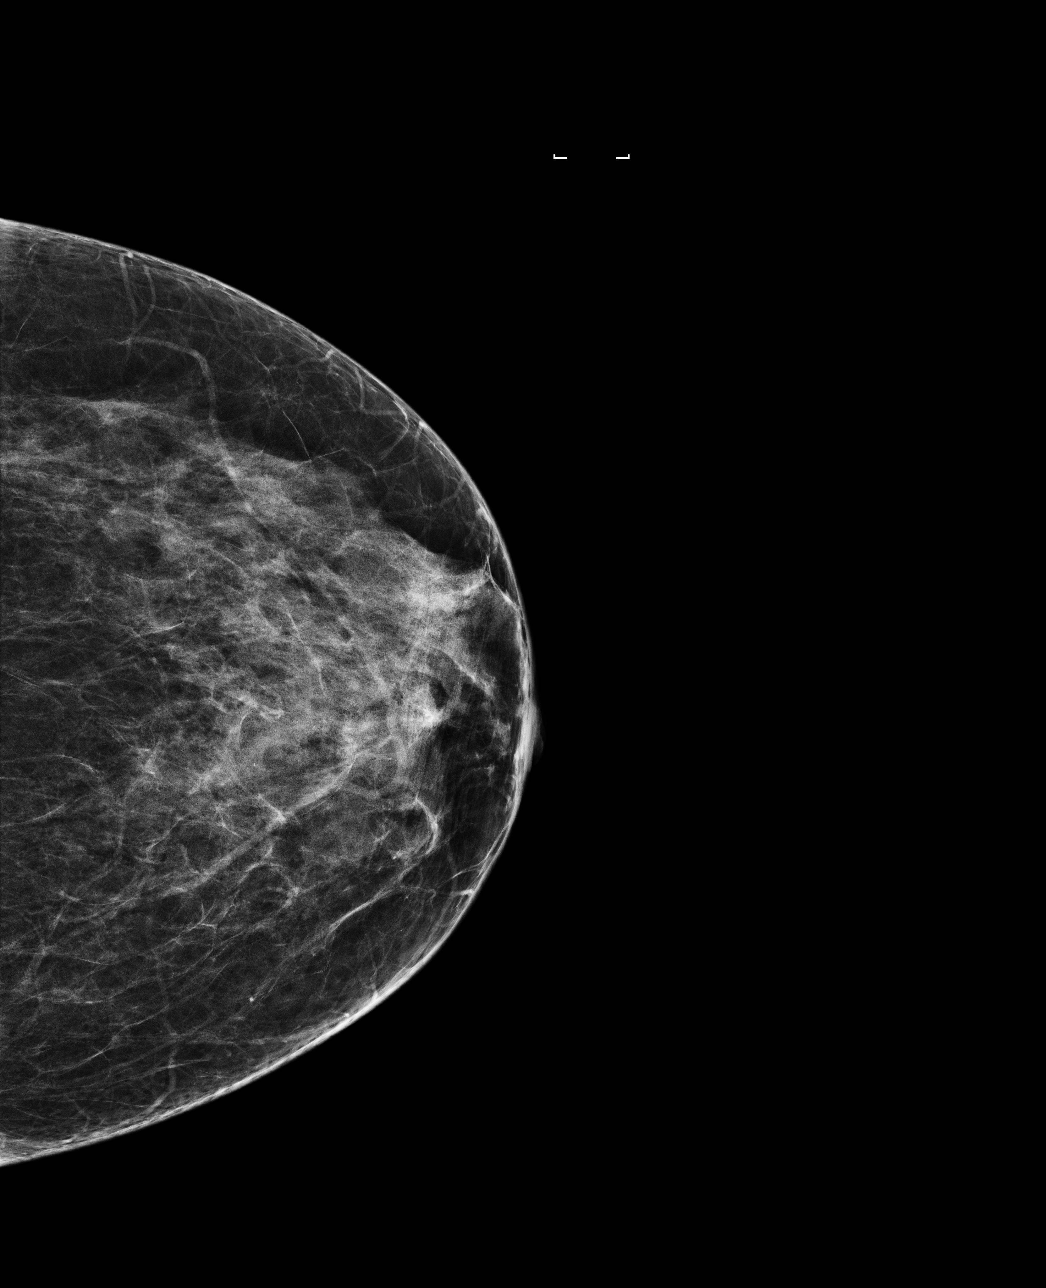

[4 of 4 positions shown; findings below may reference images not displayed]

ACR Breast Density Category c: The breast tissue is heterogeneously
dense, which may obscure small masses.
FINDINGS: There are no findings suspicious for malignancy. Images were
processed with CAD.
IMPRESSION: No mammographic evidence of malignancy. A result letter of this
screening mammogram will be mailed directly to the patient.

RECOMMENDATION:
Screening mammogram in one year. (Code:[id number])

BI-RADS CATEGORY  1: Negative.

## 2022-02-16 ENCOUNTER — Ambulatory Visit (HOSPITAL_COMMUNITY)
Admission: EM | Admit: 2022-02-16 | Discharge: 2022-02-16 | Disposition: A | Discharge status: Home / Self Care | Payer: BC Managed Care – PPO | Attending: Nurse Practitioner | Admitting: Nurse Practitioner

## 2022-02-16 ENCOUNTER — Encounter (HOSPITAL_COMMUNITY): Payer: Self-pay

## 2022-02-16 DIAGNOSIS — T7840XA Allergy, unspecified, initial encounter: Secondary | ICD-10-CM

## 2022-02-16 DIAGNOSIS — T783XXA Angioneurotic edema, initial encounter: Secondary | ICD-10-CM | POA: Diagnosis not present

## 2022-02-16 MED ORDER — PREDNISONE 10 MG (21) PO TBPK: ORAL_TABLET | Freq: Every day | ORAL | 0 refills | Status: AC

## 2022-02-16 MED ORDER — EPINEPHRINE 0.3 MG/0.3ML IJ SOAJ: 0.3000 mg | INTRAMUSCULAR | 0 refills | Status: AC | PRN

## 2022-02-16 MED ORDER — FAMOTIDINE 20 MG PO TABS: 20.0000 mg | ORAL_TABLET | Freq: Two times a day (BID) | ORAL | 0 refills | Status: AC

## 2022-02-16 MED ORDER — DEXAMETHASONE SODIUM PHOSPHATE 10 MG/ML IJ SOLN
INTRAMUSCULAR | Status: AC
  Filled 2022-02-16: qty 1

## 2022-02-16 MED ORDER — DEXAMETHASONE SODIUM PHOSPHATE 10 MG/ML IJ SOLN
10.0000 mg | Freq: Once | INTRAMUSCULAR | Status: AC
  Administered 2022-02-16: 10 mg via INTRAMUSCULAR

## 2022-02-16 NOTE — Discharge Instructions (Addendum)
Take medications as prescribed   Take bendaryl 25 mg every 4 hours for the next 24 hours. You may then reduce use to as needed.   Keep EpiPen on you at all times!   If you have to use the EpiPen, go straight to the ED after administration. Do not drive yourself to the hospital. Get a ride of call 911.   Follow-up with allergist

## 2022-02-16 NOTE — ED Triage Notes (Signed)
Pt is here for possible allergic reaction causing lip to itch and swollen since today. Pt took xyzal at 5pm

## 2022-02-16 NOTE — ED Provider Notes (Signed)
[place]    CSN: [id number] Arrival date & time: 02/16/22  1938      History   Chief Complaint Chief Complaint  Patient presents with   Allergic Reaction    HPI Anne Bradley is a 56 y.o. female.   Anne Bradley is a 56 y.o. female that presents for evaluation of itching and lip swelling. Patient noted itchiness and swelling of the lower lip around 1030 this morning while at work.  She went to the bathroom around 1:30 PM to wash her face and she applied hand sanitizer to the lower lip.  She immediately noted increased itching and burning after doing this so she washed off the hand sanitizer.  The swelling has continued throughout the day.  She denies any shortness of breath, tongue swelling, difficulty swallowing, throat itching, wheezing, cough or rash.  She denies any new exposures.  She is not on any prescribed daily medications.  She has a history of COVID 3 weeks ago which was treated supportively.  Notably, patient had a history of lip swelling about 20 years in the past after using a new lipstick but she has not had any issues with this since.  Patient left work early around 4 PM and went home to take 10 mg of Xyzal around 5 PM       History reviewed. No pertinent past medical history.  There are no problems to display for this patient.   History reviewed. No pertinent surgical history.  OB History   No obstetric history on file.      Home Medications    Prior to Admission medications   Medication Sig Start Date End Date Taking? Authorizing Provider  EPINEPHrine 0.3 mg/0.3 mL IJ SOAJ injection Inject 0.3 mg into the muscle as needed for anaphylaxis. Anaphylaxis symptoms includes: swelling of your throat and tongue; difficulty breathing or breathing very fast. difficulty swallowing; tightness in your throat or a hoarse voice; wheezing; coughing; noisy breathing.  Go the the ED immediately after use 02/16/22  Yes Enrique Sack, FNP  famotidine  (PEPCID) 20 MG tablet Take 1 tablet (20 mg total) by mouth 2 (two) times daily for 5 doses. 02/16/22 02/19/22 Yes Jaree Trinka, Aldona Bar, FNP  predniSONE (STERAPRED UNI-PAK 21 TAB) 10 MG (21) TBPK tablet Take by mouth daily. Take 6 tabs by mouth daily  for 2 days, then 5 tabs for 2 days, then 4 tabs for 2 days, then 3 tabs for 2 days, 2 tabs for 2 days, then 1 tab by mouth daily for 2 days 02/16/22  Yes Enrique Sack, FNP  cyclobenzaprine (FLEXERIL) 10 MG tablet Take 1 tablet (10 mg total) by mouth 2 (two) times daily as needed for muscle spasms. 07/14/15   Montine Circle, PA-C  HYDROcodone-acetaminophen (NORCO/VICODIN) 5-325 MG tablet Take 1-2 tablets by mouth every 6 (six) hours as needed. 07/14/15   Montine Circle, PA-C  ibuprofen (ADVIL,MOTRIN) 600 MG tablet Take 1 tablet (600 mg total) by mouth every 6 (six) hours as needed. 07/14/15   Montine Circle, PA-C  Multiple Vitamins-Minerals (MULTIVITAMIN & MINERAL PO) Take 1 tablet by mouth daily.    [provider]    Family History History reviewed. No pertinent family history.  Social History Social History   Tobacco Use   Smoking status: Never  Substance Use Topics   Alcohol use: No     Allergies   Patient has no known allergies.   Review of Systems Review of Systems  Constitutional:  Negative for  diaphoresis.  HENT:  Negative for sore throat, trouble swallowing and voice change.   Respiratory:  Negative for cough, shortness of breath and wheezing.   Gastrointestinal:  Negative for nausea and vomiting.  Skin:  Negative for rash.  All other systems reviewed and are negative.    Physical Exam Triage Vital Signs ED Triage Vitals  Enc Vitals Group     BP 02/16/22 2000 (!) 141/90     Pulse Rate 02/16/22 2000 74     Resp 02/16/22 2000 12     Temp 02/16/22 2000 98.8 F (37.1 C)     Temp Source 02/16/22 2000 Oral     SpO2 02/16/22 2000 97 %     Weight 02/16/22 2002 207 lb (93.9 kg)     Height 02/16/22 2002 5\' 5"   (1.651 m)     Head Circumference --      Peak Flow --      Pain Score 02/16/22 1958 0     Pain Loc --      Pain Edu? --      Excl. in [place]? --    No data found.  Updated Vital Signs BP (!) 141/90 (BP Location: Right Arm)   Pulse 74   Temp 98.8 F (37.1 C) (Oral)   Resp 12   Ht 5\' 5"  (1.651 m)   Wt 207 lb (93.9 kg)   SpO2 97%   BMI 34.45 kg/m   Visual Acuity Right Eye Distance:   Left Eye Distance:   Bilateral Distance:    Right Eye Near:   Left Eye Near:    Bilateral Near:     Physical Exam Vitals reviewed.  Constitutional:      General: She is not in acute distress.    Appearance: Normal appearance. She is not ill-appearing, toxic-appearing or diaphoretic.  HENT:     Head: Normocephalic.     Nose: Nose normal.     Mouth/Throat:     Lips: Pink. No lesions.     Mouth: Mucous membranes are moist. No injury.     Tongue: No lesions.     Pharynx: Oropharynx is clear. Uvula midline. No pharyngeal swelling or uvula swelling.     Comments: Significant swelling of the lower lip noted.  No other abnormalities noted within the mouth or upper lip. Eyes:     Conjunctiva/sclera: Conjunctivae normal.  Cardiovascular:     Rate and Rhythm: Normal rate.  Pulmonary:     Effort: Pulmonary effort is normal.     Breath sounds: Normal breath sounds and air entry. No decreased air movement.  Musculoskeletal:        General: Normal range of motion.     Cervical back: Normal range of motion and neck supple.  Skin:    General: Skin is warm and dry.     Findings: No rash.  Neurological:     General: No focal deficit present.     Mental Status: She is alert and oriented to person, place, and time.      UC Treatments / Results  Labs (all labs ordered are listed, but only abnormal results are displayed) Labs Reviewed - No data to display  EKG   Radiology No results found.  Procedures Procedures (including critical care time)  Medications Ordered in UC Medications   dexamethasone (DECADRON) injection 10 mg (10 mg Intramuscular Given 02/16/22 2036)    Initial Impression / Assessment and Plan / UC Course  I have reviewed the triage vital signs and  the nursing notes.  Pertinent labs & imaging results that were available during my care of the patient were reviewed by me and considered in my medical decision making (see chart for details).     56 year old female presenting with lower lip angioedema presented around 1030 this morning.  Itching resolved after taking 10 mg of Xyzal but swelling has remained. hortness of breath, tongue swelling, difficulty swallowing, throat itching, wheezing, cough or rash.  She denies any new exposures.  She is not on any prescribed daily medications.  Patient is conscious alert and oriented x3.  No acute distress.  Awake intact.  Speech is clear.  Decadron 10 mg IM given in the clinic.  Patient prescribed disown Dosepak, EpiPen and Pepcid.  She was advised to take Benadryl 25 mg every 4 hours for the next 24 hours and then can reduce the use to as needed.  Indications for EpiPen usage extensively reviewed with patient and her husband.  Vies follow-up with allergist to determine etiology of the reaction.  Today's evaluation has revealed no signs of a dangerous process. Discussed diagnosis with patient and/or guardian. Patient and/or guardian aware of their diagnosis, possible red flag symptoms to watch out for and need for close follow up. Patient and/or guardian understands verbal and written discharge instructions. Patient and/or guardian comfortable with plan and disposition.  Patient and/or guardian has a clear mental status at this time, good insight into illness (after discussion and teaching) and has clear judgment to make decisions regarding their care  Documentation was completed with the aid of voice recognition software. Transcription may contain typographical errors. Final Clinical Impressions(s) / UC Diagnoses   Final  diagnoses:  Angioedema, initial encounter  Allergic reaction, initial encounter     Discharge Instructions      Take medications as prescribed   Take bendaryl 25 mg every 4 hours for the next 24 hours. You may then reduce use to as needed.   Keep EpiPen on you at all times!   If you have to use the EpiPen, go straight to the ED after administration. Do not drive yourself to the hospital. Get a ride of call 911.   Follow-up with allergist      ED Prescriptions     Medication Sig Dispense Auth. Provider   predniSONE (STERAPRED UNI-PAK 21 TAB) 10 MG (21) TBPK tablet Take by mouth daily. Take 6 tabs by mouth daily  for 2 days, then 5 tabs for 2 days, then 4 tabs for 2 days, then 3 tabs for 2 days, 2 tabs for 2 days, then 1 tab by mouth daily for 2 days 42 tablet Lular Letson, Aldona Bar, FNP   famotidine (PEPCID) 20 MG tablet Take 1 tablet (20 mg total) by mouth 2 (two) times daily for 5 doses. 5 tablet Enrique Sack, FNP   EPINEPHrine 0.3 mg/0.3 mL IJ SOAJ injection Inject 0.3 mg into the muscle as needed for anaphylaxis. Anaphylaxis symptoms includes: swelling of your throat and tongue; difficulty breathing or breathing very fast. difficulty swallowing; tightness in your throat or a hoarse voice; wheezing; coughing; noisy breathing.  Go the the ED immediately after use 1 each Enrique Sack, FNP      PDMP not reviewed this encounter.   Enrique Sack, [employer] 02/16/22 2048
